# Patient Record
Sex: Female | Born: 1985
Health system: Southern US, Community
[De-identification: ages and names within clinical notes are randomized; demographics above are authoritative.]

## PROBLEM LIST (undated history)

## (undated) DIAGNOSIS — D649 Anemia, unspecified: Secondary | ICD-10-CM

---

## 2015-11-09 ENCOUNTER — Ambulatory Visit (HOSPITAL_COMMUNITY)
Admission: EM | Admit: 2015-11-09 | Discharge: 2015-11-09 | Disposition: A | Discharge status: Home / Self Care | Payer: BLUE CROSS/BLUE SHIELD | Attending: Emergency Medicine | Admitting: Emergency Medicine

## 2015-11-09 ENCOUNTER — Encounter (HOSPITAL_COMMUNITY): Payer: Self-pay | Admitting: Emergency Medicine

## 2015-11-09 ENCOUNTER — Ambulatory Visit (INDEPENDENT_AMBULATORY_CARE_PROVIDER_SITE_OTHER): Payer: BLUE CROSS/BLUE SHIELD

## 2015-11-09 DIAGNOSIS — R5383 Other fatigue: Secondary | ICD-10-CM

## 2015-11-09 DIAGNOSIS — J40 Bronchitis, not specified as acute or chronic: Secondary | ICD-10-CM

## 2015-11-09 HISTORY — DX: Anemia, unspecified: D64.9

## 2015-11-09 MED ORDER — ACETAMINOPHEN 325 MG PO TABS
ORAL_TABLET | ORAL | Status: AC
  Filled 2015-11-09: qty 2

## 2015-11-09 MED ORDER — BENZONATATE 100 MG PO CAPS: 100.0000 mg | ORAL_CAPSULE | Freq: Three times a day (TID) | ORAL | 0 refills | Status: DC | PRN

## 2015-11-09 MED ORDER — PREDNISONE 20 MG PO TABS: 40.0000 mg | ORAL_TABLET | Freq: Every day | ORAL | 0 refills | Status: AC

## 2015-11-09 MED ORDER — ALBUTEROL SULFATE HFA 108 (90 BASE) MCG/ACT IN AERS: 2.0000 | INHALATION_SPRAY | Freq: Four times a day (QID) | RESPIRATORY_TRACT | 0 refills | Status: DC | PRN

## 2015-11-09 MED ORDER — ACETAMINOPHEN 325 MG PO TABS
650.0000 mg | ORAL_TABLET | Freq: Once | ORAL | Status: AC
  Administered 2015-11-09: 650 mg via ORAL

## 2015-11-09 NOTE — Discharge Instructions (Signed)
Start Prednisone daily for 5 days. Use Albuterol inhaler 2 puffs every 6 hours as needed for wheezing and coughing. Take Tessalon cough pills every 8 hours as needed for cough. Increase fluid intake to help loosen up mucus. Follow-up with your primary care provider if symptoms do not improve within 3 to 4 days.

## 2015-11-09 NOTE — ED Provider Notes (Signed)
CSN: [id number]     Arrival date & time 11/09/15  1459 History   First MD Initiated Contact with Patient 11/09/15 1733     No chief complaint on file.  (Consider location/radiation/quality/duration/timing/severity/associated sxs/prior Treatment) 30 year old female presents with decreased energy, cough, fever up to 101/102, nasal congestion for the past 3 days. Getting worse today- having difficulty staying awake. Denies any GI symptoms. Has taken Tylenol and cough syrup with minimal relief. Does have history of anemia and has been on Fe pills in the past.    The history is provided by the patient.    Past Medical History:  Diagnosis Date  . Anemia    History reviewed. No pertinent surgical history. No family history on file. Social History  Substance Use Topics  . Smoking status: Never Smoker  . Smokeless tobacco: Never Used  . Alcohol use Yes   OB History    No data available     Review of Systems  Constitutional: Positive for chills, fatigue and fever.  HENT: Positive for congestion, rhinorrhea and sinus pressure. Negative for ear pain and sore throat.   Eyes: Negative for discharge.  Respiratory: Positive for cough and wheezing. Negative for chest tightness and shortness of breath.   Cardiovascular: Negative for chest pain.  Gastrointestinal: Negative for abdominal pain, diarrhea, nausea and vomiting.  Musculoskeletal: Negative for neck pain and neck stiffness.  Skin: Negative for rash.  Neurological: Positive for headaches. Negative for dizziness, syncope, weakness, light-headedness and numbness.  Hematological: Negative for adenopathy.    Allergies  Penicillins  Home Medications   Prior to Admission medications   Medication Sig Start Date End Date Taking? Authorizing Provider  albuterol (PROVENTIL HFA;VENTOLIN HFA) 108 (90 Base) MCG/ACT inhaler Inhale 2 puffs into the lungs every 6 (six) hours as needed for wheezing or shortness of breath. 11/09/15   Katy Apo, NP  benzonatate (TESSALON) 100 MG capsule Take 1 capsule (100 mg total) by mouth 3 (three) times daily as needed for cough. 11/09/15   Katy Apo, NP  predniSONE (DELTASONE) 20 MG tablet Take 2 tablets (40 mg total) by mouth daily. 11/09/15 11/14/15  Katy Apo, NP   Meds Ordered and Administered this Visit   Medications  acetaminophen (TYLENOL) tablet 650 mg (650 mg Oral Given 11/09/15 1645)    BP 123/60 (BP Location: Left Arm) Comment (BP Location): large cuff  Pulse 87   Temp 101.1 F (38.4 C) (Oral)   Resp 22   LMP 10/27/2015   SpO2 100%  No data found.   Physical Exam  Constitutional: She is oriented to person, place, and time. She appears well-developed and well-nourished. She appears ill. No distress.  HENT:  Head: Normocephalic and atraumatic.  Right Ear: Hearing, tympanic membrane, external ear and ear canal normal.  Left Ear: Hearing, tympanic membrane, external ear and ear canal normal.  Nose: Mucosal edema and rhinorrhea present. Right sinus exhibits no maxillary sinus tenderness and no frontal sinus tenderness. Left sinus exhibits no maxillary sinus tenderness and no frontal sinus tenderness.  Mouth/Throat: Uvula is midline, oropharynx is clear and moist and mucous membranes are normal.  Neck: Normal range of motion. Neck supple.  Cardiovascular: Normal rate, regular rhythm and normal heart sounds.   Pulmonary/Chest: Effort normal. No respiratory distress. She has decreased breath sounds (very coarse breath sounds) in the left upper field and the left lower field. She has wheezes in the right upper field and the left upper field. She has  rhonchi in the right upper field, the right lower field, the left upper field and the left lower field.  Lymphadenopathy:    She has no cervical adenopathy.  Neurological: She is alert and oriented to person, place, and time.  Skin: Skin is warm and dry. Capillary refill takes less than 2 seconds.  Psychiatric: She has a  normal mood and affect. Her behavior is normal. Judgment and thought content normal.    Urgent Care Course   Clinical Course    Procedures (including critical care time)  Labs Review Labs Reviewed - No data to display  Imaging Review Dg Chest 2 View  Result Date: 11/09/2015 CLINICAL DATA:  30 year old female with a history of weakness and fatigue EXAM: CHEST  2 VIEW COMPARISON:  None. FINDINGS: The heart size and mediastinal contours are within normal limits. Both lungs are clear. The visualized skeletal structures are unremarkable. IMPRESSION: No radiographic evidence of acute cardiopulmonary disease Signed, Dulcy Fanny. Earleen Newport, DO [facility] Radiology Electronically Signed   By: Corrie Mckusick D.O.   On: 11/09/2015 18:25     Visual Acuity Review  Right Eye Distance:   Left Eye Distance:   Bilateral Distance:    Right Eye Near:   Left Eye Near:    Bilateral Near:         MDM   1. Bronchitis   2. Fatigue, unspecified type    Reviewed chest x-ray with patient- no pneumonia. Recommend use Albuterol 2 puffs every 6 hours as needed for wheezing and cough. Recommend Benzonatate 1 tablet every 8 hours as needed for cough. Start Prednisone 40mg  daily for 5 days. May continue Tylenol as needed for fever. Recommend follow-up with a primary care provider in 3 to 4 days if not improving and within the next few weeks for routine labwork if fatigue does not improve.    Katy Apo, NP 11/10/15 [phone]

## 2015-11-09 NOTE — ED Triage Notes (Addendum)
Lack of energy that started 3 days ago.  Reports she normally takes iron and has not taken in 6 months.    Reports having mucus in throat and chest that started 2 days ago.  Patient reports chest tightness and cough.  Patient is repeatedly clearing throat and spitting up drainage from sinus.    Patient has said she has difficulty sleeping due to cough and congestion.  Patient says she is having trouble staying awake.   Recently moved to [place] from [place].

## 2016-11-13 ENCOUNTER — Encounter (HOSPITAL_COMMUNITY): Payer: Self-pay | Admitting: Family Medicine

## 2016-11-13 ENCOUNTER — Ambulatory Visit (HOSPITAL_COMMUNITY)
Admission: EM | Admit: 2016-11-13 | Discharge: 2016-11-13 | Disposition: A | Discharge status: Home / Self Care | Payer: 59 | Attending: Internal Medicine | Admitting: Internal Medicine

## 2016-11-13 DIAGNOSIS — Z113 Encounter for screening for infections with a predominantly sexual mode of transmission: Secondary | ICD-10-CM

## 2016-11-13 DIAGNOSIS — A6004 Herpesviral vulvovaginitis: Secondary | ICD-10-CM | POA: Insufficient documentation

## 2016-11-13 DIAGNOSIS — Z79899 Other long term (current) drug therapy: Secondary | ICD-10-CM | POA: Diagnosis not present

## 2016-11-13 DIAGNOSIS — N760 Acute vaginitis: Secondary | ICD-10-CM

## 2016-11-13 MED ORDER — VALACYCLOVIR HCL 1 G PO TABS: 1000.0000 mg | ORAL_TABLET | Freq: Every day | ORAL | 0 refills | Status: AC

## 2016-11-13 MED ORDER — FLUCONAZOLE 200 MG PO TABS: 200.0000 mg | ORAL_TABLET | Freq: Once | ORAL | 0 refills | Status: AC

## 2016-11-13 NOTE — ED Triage Notes (Signed)
Pt here for herpes outbreak.

## 2016-11-13 NOTE — Discharge Instructions (Signed)
Take diflucan tonight, may use OTC monistat externally as needed for itching. 5 days of valtrex for herpes. We will notify you of any positive findings from testing tonight, and if any changes to treatment need to be made. Please follow up with [facility]CP or [facility] if symptoms persist or do not improve

## 2016-11-13 NOTE — ED Provider Notes (Signed)
[place]    CSN: [id number] Arrival date & time: 11/13/16  1649     History   Chief Complaint Chief Complaint  Patient presents with  . Herpes Zoster    HPI Jamie Fitzgerald is a 31 y.o. female.   Jamie Fitzgerald presents with complaints of vulvovaginal rash which has been present for the past month and is uncomfortable. She has had herpes diagnosed in the past and this feels similar. Has been taking OTC Lysine which has not helped. She is sexually active with two partners, does not always use protection. LMP 10/28. She requests additional and complete STD screening tonight. States she has had itching and white discharge, itching is worse at night. Denies abdominal pain, back pain or fevers.    ROS per HPI.       Past Medical History:  Diagnosis Date  . Anemia     There are no active problems to display for this patient.   History reviewed. No pertinent surgical history.  OB History    No data available       Home Medications    Prior to Admission medications   Medication Sig Start Date End Date Taking? Authorizing Provider  albuterol (PROVENTIL HFA;VENTOLIN HFA) 108 (90 Base) MCG/ACT inhaler Inhale 2 puffs into the lungs every 6 (six) hours as needed for wheezing or shortness of breath. 11/09/15   Katy Apo, NP  benzonatate (TESSALON) 100 MG capsule Take 1 capsule (100 mg total) by mouth 3 (three) times daily as needed for cough. 11/09/15   Katy Apo, NP  valACYclovir (VALTREX) 1000 MG tablet Take 1 tablet (1,000 mg total) daily for 5 days by mouth. 11/13/16 11/18/16  Zigmund Gottron, NP    Family History History reviewed. No pertinent family history.  Social History Social History   Tobacco Use  . Smoking status: Never Smoker  . Smokeless tobacco: Never Used  Substance Use Topics  . Alcohol use: Yes  . Drug use: No     Allergies   Penicillins   Review of Systems Review of Systems   Physical Exam Triage Vital Signs ED  Triage Vitals  Enc Vitals Group     BP 11/13/16 1717 (!) 148/88     Pulse Rate 11/13/16 1717 60     Resp 11/13/16 1717 18     Temp 11/13/16 1717 98.5 F (36.9 C)     Temp src --      SpO2 11/13/16 1717 100 %     Weight --      Height --      Head Circumference --      Peak Flow --      Pain Score 11/13/16 1715 3     Pain Loc --      Pain Edu? --      Excl. in [place]? --    No data found.  Updated Vital Signs BP (!) 148/88   Pulse 60   Temp 98.5 F (36.9 C)   Resp 18   LMP 11/01/2016   SpO2 100%   Visual Acuity Right Eye Distance:   Left Eye Distance:   Bilateral Distance:    Right Eye Near:   Left Eye Near:    Bilateral Near:     Physical Exam  Constitutional: She is oriented to person, place, and time. She appears well-developed and well-nourished. No distress.  Cardiovascular: Normal rate, regular rhythm and normal heart sounds.  Pulmonary/Chest: Effort normal and breath sounds normal.  Abdominal:  Soft. There is no tenderness.  Genitourinary:    There is rash on the right labia. There is no lesion on the right labia. There is rash on the left labia. There is no lesion on the left labia.  Genitourinary Comments: Red smooth rash with defined edges noted; without open lesions, drainage or sores; without obvious discharge on visual exam  Neurological: She is alert and oriented to person, place, and time.  Skin: Skin is warm and dry.  Vitals reviewed.    UC Treatments / Results  Labs (all labs ordered are listed, but only abnormal results are displayed) Labs Reviewed  RPR  HIV ANTIBODY (ROUTINE TESTING)  URINE CYTOLOGY ANCILLARY ONLY    EKG  EKG Interpretation None       Radiology No results found.  Procedures Procedures (including critical care time)  Medications Ordered in UC Medications - No data to display   Initial Impression / Assessment and Plan / UC Course  I have reviewed the triage vital signs and the nursing notes.  Pertinent labs  & imaging results that were available during my care of the patient were reviewed by me and considered in my medical decision making (see chart for details).     Without obvious herpetic lesions, agreeable to treatment tonight however due to persistent rash and history of herpes. Rash more consistent with candidal vulvovaginitis. Diflucan provided tonight pending cytology results. Will notify of any positive findings and if any changes to treatment are needed. If symptoms worsen or do not improve in the next week to return to be seen or to follow up with PCP. Patient verbalized understanding and agreeable to plan.    Final Clinical Impressions(s) / UC Diagnoses   Final diagnoses:  Herpes simplex vulvovaginitis  Screen for STD (sexually transmitted disease)    ED Discharge Orders        Ordered    valACYclovir (VALTREX) 1000 MG tablet  Daily     11/13/16 1820       Controlled Substance Prescriptions [place] Controlled Substance Registry consulted? Not Applicable   Zigmund Gottron, NP 11/13/16 1844

## 2016-11-14 LAB — RPR: RPR Ser Ql: NONREACTIVE

## 2016-11-14 LAB — HIV ANTIBODY (ROUTINE TESTING W REFLEX): HIV Screen 4th Generation wRfx: NONREACTIVE

## 2016-11-16 LAB — URINE CYTOLOGY ANCILLARY ONLY
CHLAMYDIA, DNA PROBE: NEGATIVE
Neisseria Gonorrhea: NEGATIVE
Trichomonas: NEGATIVE

## 2016-11-18 LAB — URINE CYTOLOGY ANCILLARY ONLY
BACTERIAL VAGINITIS: POSITIVE — AB
CANDIDA VAGINITIS: POSITIVE — AB

## 2016-11-19 ENCOUNTER — Telehealth (HOSPITAL_COMMUNITY): Payer: Self-pay | Admitting: Emergency Medicine

## 2016-11-19 MED ORDER — METRONIDAZOLE 500 MG PO TABS: 500.0000 mg | ORAL_TABLET | Freq: Two times a day (BID) | ORAL | 0 refills | Status: DC

## 2016-11-19 NOTE — Telephone Encounter (Signed)
-----   Message from Sherlene Shams, MD sent at 11/18/2016  3:33 PM EST ----- Please let patient know that test for candida (yeast) was positive.  Rx fluconazole was given at the urgent care visit 11/9.   Test for gardnerella (bacterial vaginosis) was also positive.  This only needs to be treated if there are symptoms, such as persistent vaginal irritation/discharge.   If these symptoms are present, ok to send rx for metronidazole 500mg  bid x 7d #14 no refills or metronidazole vaginal gel 0.24% 1 applicatorful bid x 7d #09 no refills.   Recheck for further evaluation if symptoms are not improving.  LM

## 2016-11-19 NOTE — Addendum Note (Signed)
Addended by: Aquilla Solian E on: 11/19/2016 08:01 PM   Modules accepted: Orders

## 2016-11-19 NOTE — Telephone Encounter (Signed)
Called pt to notify of recent lab results.... Pt ID'd properly Reports feeling better and sx are subsiding Pt is asymptomatic but requests for flagyl to be called into [facility] [employer]) Adv pt if sx are not getting better to return or to f/u w/PCP Education on safe sex given Notified pt that lab results can be obtained through MyChart Pt verb understanding.

## 2017-01-01 ENCOUNTER — Encounter (HOSPITAL_COMMUNITY): Payer: Self-pay | Admitting: Family Medicine

## 2017-01-01 ENCOUNTER — Ambulatory Visit (HOSPITAL_COMMUNITY)
Admission: EM | Admit: 2017-01-01 | Discharge: 2017-01-01 | Disposition: A | Discharge status: Home / Self Care | Payer: 59 | Attending: Internal Medicine | Admitting: Internal Medicine

## 2017-01-01 DIAGNOSIS — N898 Other specified noninflammatory disorders of vagina: Secondary | ICD-10-CM | POA: Diagnosis not present

## 2017-01-01 DIAGNOSIS — Z880 Allergy status to penicillin: Secondary | ICD-10-CM | POA: Diagnosis not present

## 2017-01-01 DIAGNOSIS — Z79899 Other long term (current) drug therapy: Secondary | ICD-10-CM | POA: Diagnosis not present

## 2017-01-01 LAB — POCT PREGNANCY, URINE: Preg Test, Ur: NEGATIVE

## 2017-01-01 MED ORDER — FLUCONAZOLE 150 MG PO TABS: 150.0000 mg | ORAL_TABLET | Freq: Every day | ORAL | 0 refills | Status: DC

## 2017-01-01 MED ORDER — METRONIDAZOLE 0.75 % VA GEL: 1.0000 | Freq: Two times a day (BID) | VAGINAL | 0 refills | Status: AC

## 2017-01-01 NOTE — ED Provider Notes (Signed)
[place]    CSN: [id number] Arrival date & time: 01/01/17  1010     History   Chief Complaint Chief Complaint  Patient presents with  . Vaginitis    HPI Jamie Fitzgerald is a 31 y.o. female.   31 year old female comes in for a few day history of vaginal discharge, itchiness, irritation.  She states that she was treated for BV and yeast about a month ago, at that time, she split her BV treatment with her partner as she thought partner may have passed BV onto her.  She started  feeling similar irritations a few days ago after sexual intercourse, she then applied some scented lotion, which made symptoms worse and with vaginal irritation.  Denies fever, chills, night sweats.  Denies urinary symptoms such as discharge, frequency, dysuria, hematuria.  Denies abdominal pain, nausea, vomiting.  Sexually active with one partner, but no condom use, no other forms of birth control use.      Past Medical History:  Diagnosis Date  . Anemia     There are no active problems to display for this patient.   History reviewed. No pertinent surgical history.  OB History    No data available       Home Medications    Prior to Admission medications   Medication Sig Start Date End Date Taking? Authorizing Provider  albuterol (PROVENTIL HFA;VENTOLIN HFA) 108 (90 Base) MCG/ACT inhaler Inhale 2 puffs into the lungs every 6 (six) hours as needed for wheezing or shortness of breath. 11/09/15   Katy Apo, NP  fluconazole (DIFLUCAN) 150 MG tablet Take 1 tablet (150 mg total) by mouth daily. Take second dose 72 hours later if symptoms still persists. 01/01/17   Tasia Catchings, Elisa Sorlie V, PA-C  metroNIDAZOLE (METROGEL VAGINAL) 0.75 % vaginal gel Place 1 Applicatorful vaginally 2 (two) times daily for 5 days. 01/01/17 01/06/17  Ok Edwards, PA-C    Family History History reviewed. No pertinent family history.  Social History Social History   Tobacco Use  . Smoking status: Never Smoker  .  Smokeless tobacco: Never Used  Substance Use Topics  . Alcohol use: Yes  . Drug use: No     Allergies   Penicillins   Review of Systems Review of Systems  Reason unable to perform ROS: See HPI as above.     Physical Exam Triage Vital Signs ED Triage Vitals [01/01/17 1049]  Enc Vitals Group     BP 124/63     Pulse Rate 71     Resp 18     Temp 98.2 F (36.8 C)     Temp Source Oral     SpO2 100 %     Weight      Height      Head Circumference      Peak Flow      Pain Score      Pain Loc      Pain Edu?      Excl. in [place]?    No data found.  Updated Vital Signs BP 124/63 (BP Location: Left Arm)   Pulse 71   Temp 98.2 F (36.8 C) (Oral)   Resp 18   LMP 12/18/2016   SpO2 100%   Physical Exam  Constitutional: She is oriented to person, place, and time. She appears well-developed and well-nourished. No distress.  HENT:  Head: Normocephalic and atraumatic.  Eyes: Conjunctivae are normal. Pupils are equal, round, and reactive to light.  Cardiovascular: Normal rate, regular  rhythm and normal heart sounds. Exam reveals no gallop and no friction rub.  No murmur heard. Pulmonary/Chest: Effort normal and breath sounds normal. She has no wheezes. She has no rales.  Abdominal: Soft. Bowel sounds are normal. She exhibits no mass. There is no tenderness. There is no rebound, no guarding and no CVA tenderness.  Neurological: She is alert and oriented to person, place, and time.  Skin: Skin is warm and dry.  Psychiatric: She has a normal mood and affect. Her behavior is normal. Judgment normal.     UC Treatments / Results  Labs (all labs ordered are listed, but only abnormal results are displayed) Labs Reviewed  POCT PREGNANCY, URINE  URINE CYTOLOGY ANCILLARY ONLY    EKG  EKG Interpretation None       Radiology No results found.  Procedures Procedures (including critical care time)  Medications Ordered in UC Medications - No data to display   Initial  Impression / Assessment and Plan / UC Course  I have reviewed the triage vital signs and the nursing notes.  Pertinent labs & imaging results that were available during my care of the patient were reviewed by me and considered in my medical decision making (see chart for details).    Patient was treated empirically for yeast and BV. Start Diflucan and MetroGel as directed.  Refrain use of scented products, monitor for any new exposures. Cytology sent, patient will be contacted with any positive results that require additional treatment. Patient to refrain from sexual activity for the next 7 days. Return precautions given.    Final Clinical Impressions(s) / UC Diagnoses   Final diagnoses:  Vaginal discharge    ED Discharge Orders        Ordered    metroNIDAZOLE (METROGEL VAGINAL) 0.75 % vaginal gel  2 times daily     01/01/17 1145    fluconazole (DIFLUCAN) 150 MG tablet  Daily     01/01/17 1145         Ok Edwards, [place] 01/01/17 1149

## 2017-01-01 NOTE — ED Triage Notes (Signed)
Pt here for redness and irritation to vagina after using a cream on her vagina that was scented.

## 2017-01-01 NOTE — Discharge Instructions (Addendum)
You were treated empirically for yeast and bacterial vaginitis.  Start Diflucan and MetroGel as directed.  Cytology sent, you will be contacted with any positive results that requires further treatment. Refrain from sexual activity for the next 7 days. Monitor for any worsening of symptoms, fever, abdominal pain, nausea, vomiting, to follow up for reevaluation.

## 2017-01-04 LAB — URINE CYTOLOGY ANCILLARY ONLY
Chlamydia: NEGATIVE
NEISSERIA GONORRHEA: NEGATIVE
TRICH (WINDOWPATH): NEGATIVE

## 2017-01-07 LAB — URINE CYTOLOGY ANCILLARY ONLY: CANDIDA VAGINITIS: NEGATIVE

## 2017-01-08 ENCOUNTER — Telehealth (HOSPITAL_COMMUNITY): Payer: Self-pay | Admitting: *Deleted

## 2018-08-23 ENCOUNTER — Other Ambulatory Visit: Payer: Self-pay

## 2018-08-23 ENCOUNTER — Encounter (HOSPITAL_COMMUNITY): Payer: Self-pay | Admitting: Emergency Medicine

## 2018-08-23 ENCOUNTER — Emergency Department (HOSPITAL_COMMUNITY)
Admission: EM | Admit: 2018-08-23 | Discharge: 2018-08-24 | Disposition: A | Discharge status: Home / Self Care | Payer: Commercial Managed Care - PPO | Attending: Emergency Medicine | Admitting: Emergency Medicine

## 2018-08-23 DIAGNOSIS — D259 Leiomyoma of uterus, unspecified: Secondary | ICD-10-CM | POA: Insufficient documentation

## 2018-08-23 DIAGNOSIS — R102 Pelvic and perineal pain: Secondary | ICD-10-CM | POA: Insufficient documentation

## 2018-08-23 DIAGNOSIS — N83202 Unspecified ovarian cyst, left side: Secondary | ICD-10-CM

## 2018-08-23 DIAGNOSIS — N939 Abnormal uterine and vaginal bleeding, unspecified: Secondary | ICD-10-CM | POA: Diagnosis present

## 2018-08-23 LAB — CBC
HCT: 38.3 % (ref 36.0–46.0)
Hemoglobin: 11 g/dL — ABNORMAL LOW (ref 12.0–15.0)
MCH: 21 pg — ABNORMAL LOW (ref 26.0–34.0)
MCHC: 28.7 g/dL — ABNORMAL LOW (ref 30.0–36.0)
MCV: 73.2 fL — ABNORMAL LOW (ref 80.0–100.0)
Platelets: 353 10*3/uL (ref 150–400)
RBC: 5.23 MIL/uL — ABNORMAL HIGH (ref 3.87–5.11)
RDW: 15 % (ref 11.5–15.5)
WBC: 5.6 10*3/uL (ref 4.0–10.5)
nRBC: 0 % (ref 0.0–0.2)

## 2018-08-23 LAB — COMPREHENSIVE METABOLIC PANEL
ALT: 25 U/L (ref 0–44)
AST: 33 U/L (ref 15–41)
Albumin: 4.1 g/dL (ref 3.5–5.0)
Alkaline Phosphatase: 48 U/L (ref 38–126)
Anion gap: 12 (ref 5–15)
BUN: 5 mg/dL — ABNORMAL LOW (ref 6–20)
CO2: 21 mmol/L — ABNORMAL LOW (ref 22–32)
Calcium: 9.4 mg/dL (ref 8.9–10.3)
Chloride: 105 mmol/L (ref 98–111)
Creatinine, Ser: 0.73 mg/dL (ref 0.44–1.00)
GFR calc Af Amer: >60 mL/min (ref >60)
GFR calc non Af Amer: >60 mL/min (ref >60)
Glucose, Bld: 80 mg/dL (ref 70–99)
Potassium: 3.9 mmol/L (ref 3.5–5.1)
Sodium: 138 mmol/L (ref 135–145)
Total Bilirubin: 0.3 mg/dL (ref 0.3–1.2)
Total Protein: 7.9 g/dL (ref 6.5–8.1)

## 2018-08-23 LAB — I-STAT BETA HCG BLOOD, ED (MC, WL, AP ONLY): I-stat hCG, quantitative: <5 m[IU]/mL (ref <5)

## 2018-08-23 LAB — LIPASE, BLOOD: Lipase: 27 U/L (ref 11–51)

## 2018-08-23 MED ORDER — SODIUM CHLORIDE 0.9% FLUSH: 3.0000 mL | Freq: Once | INTRAVENOUS | Status: DC

## 2018-08-23 NOTE — ED Triage Notes (Signed)
Pt c/o abd pain 6/10 since last Thursday, pt states she started having vaginal bleed since last Wednesday with a big blood cloth on Thursday, pt states she never had a blood cloth like this she will like to be check for possible miscarriage or fibroids, per pt she took 2 pregnancy test and they were negative. No fever, chills, nausea or vomiting.

## 2018-08-24 ENCOUNTER — Emergency Department (HOSPITAL_COMMUNITY): Payer: Commercial Managed Care - PPO

## 2018-08-24 LAB — URINALYSIS, ROUTINE W REFLEX MICROSCOPIC
Bilirubin Urine: NEGATIVE
Glucose, UA: NEGATIVE mg/dL
Ketones, ur: 5 mg/dL — AB
Leukocytes,Ua: NEGATIVE
Nitrite: NEGATIVE
Protein, ur: 100 mg/dL — AB
RBC / HPF: >50 RBC/hpf — ABNORMAL HIGH (ref 0–5)
Specific Gravity, Urine: 1.023 (ref 1.005–1.030)
pH: 5 (ref 5.0–8.0)

## 2018-08-24 MED ORDER — IBUPROFEN 800 MG PO TABS: 800.0000 mg | ORAL_TABLET | Freq: Three times a day (TID) | ORAL | 0 refills | Status: DC

## 2018-08-24 NOTE — ED Notes (Signed)
Patient verbalizes understanding of discharge instructions. Opportunity for questioning and answers were provided. Armband removed by staff, pt discharged from ED ambulatory.   

## 2018-08-24 NOTE — ED Provider Notes (Signed)
[facility] EMERGENCY DEPARTMENT Provider Note   CSN: [id number] Arrival date & time: 08/23/18  4010     History   Chief Complaint Chief Complaint  Patient presents with  . Abdominal Pain    HPI Jamie Fitzgerald is a 33 y.o. female.     Patient presents to the emergency department with a chief complaint of vaginal bleeding.  She states that she passed a very large blood clot.  She was concerned that there might be something else going on.  She still has some cramping and some left adnexal pain.  She did not take anything for her symptoms.  She has tried an at home pregnancy test, which was negative x2.  She denies any fever, chills, or cough.  Denies any nausea, vomiting, or diarrhea.  Denies any dysuria.  Denies any unusual vaginal discharge, other than the bleeding.  The history is provided by the patient. No language interpreter was used.    Past Medical History:  Diagnosis Date  . Anemia     There are no active problems to display for this patient.   History reviewed. No pertinent surgical history.   OB History   No obstetric history on file.      Home Medications    Prior to Admission medications   Medication Sig Start Date End Date Taking? Authorizing Provider  albuterol (PROVENTIL HFA;VENTOLIN HFA) 108 (90 Base) MCG/ACT inhaler Inhale 2 puffs into the lungs every 6 (six) hours as needed for wheezing or shortness of breath. 11/09/15   Katy Apo, NP  fluconazole (DIFLUCAN) 150 MG tablet Take 1 tablet (150 mg total) by mouth daily. Take second dose 72 hours later if symptoms still persists. 01/01/17   Ok Edwards, PA-C    Family History No family history on file.  Social History Social History   Tobacco Use  . Smoking status: Never Smoker  . Smokeless tobacco: Never Used  Substance Use Topics  . Alcohol use: Yes  . Drug use: No     Allergies   Penicillins   Review of Systems Review of Systems  All other systems reviewed  and are negative.    Physical Exam Updated Vital Signs BP (!) 151/81 (BP Location: Right Arm)   Pulse 61   Temp 98.7 F (37.1 C) (Oral)   Resp 18   Ht 5\' 8"  (1.727 m)   Wt 95.3 kg   LMP 08/17/2018   SpO2 100%   BMI 31.93 kg/m   Physical Exam Vitals signs and nursing note reviewed.  Constitutional:      General: She is not in acute distress.    Appearance: She is well-developed.  HENT:     Head: Normocephalic and atraumatic.  Eyes:     Conjunctiva/sclera: Conjunctivae normal.  Neck:     Musculoskeletal: Neck supple.  Cardiovascular:     Rate and Rhythm: Normal rate and regular rhythm.     Heart sounds: No murmur.  Pulmonary:     Effort: Pulmonary effort is normal. No respiratory distress.     Breath sounds: Normal breath sounds.  Abdominal:     Palpations: Abdomen is soft.     Tenderness: There is no abdominal tenderness.  Musculoskeletal: Normal range of motion.  Skin:    General: Skin is warm and dry.  Neurological:     Mental Status: She is alert and oriented to person, place, and time.  Psychiatric:        Mood and Affect: Mood normal.  Behavior: Behavior normal.      ED Treatments / Results  Labs (all labs ordered are listed, but only abnormal results are displayed) Labs Reviewed  COMPREHENSIVE METABOLIC PANEL - Abnormal; Notable for the following components:      Result Value   CO2 21 (*)    BUN 5 (*)    All other components within normal limits  CBC - Abnormal; Notable for the following components:   RBC 5.23 (*)    Hemoglobin 11.0 (*)    MCV 73.2 (*)    MCH 21.0 (*)    MCHC 28.7 (*)    All other components within normal limits  LIPASE, BLOOD  URINALYSIS, ROUTINE W REFLEX MICROSCOPIC  I-STAT BETA HCG BLOOD, ED ([facility], [facility], [facility] ONLY)    EKG None  Radiology No results found.  Procedures Procedures (including critical care time)  Medications Ordered in ED Medications  sodium chloride flush (NS) 0.9 % injection 3 mL (has no  administration in time range)     Initial Impression / Assessment and Plan / ED Course  I have reviewed the triage vital signs and the nursing notes.  Pertinent labs & imaging results that were available during my care of the patient were reviewed by me and considered in my medical decision making (see chart for details).        Patient was vaginal bleeding and lower abdominal pain.  Vital signs are stable.  Patient is in no acute distress. She is not pregnant.  Hemoglobin is 11.0.  Ultrasound shows uterine fibroids and ovarian cyst.  Will refer to OB/GYN.  Will give anti-inflammatories. Final Clinical Impressions(s) / ED Diagnoses   Final diagnoses:  Uterine leiomyoma, unspecified location  Cyst of left ovary    ED Discharge Orders    None       Montine Circle, PA-C 08/24/18 0235    Ripley Fraise, MD 08/24/18 [phone]

## 2018-08-24 NOTE — ED Notes (Signed)
Sent urine culture with the specimen

## 2018-10-18 IMAGING — DX DG CHEST 2V
2 series · 2 of 2 positions shown · non-contrast
Comparison: None.

CLINICAL DATA: 30-year-old female with a history of weakness and
fatigue

EXAM:
CHEST  2 VIEW

[chest pa]
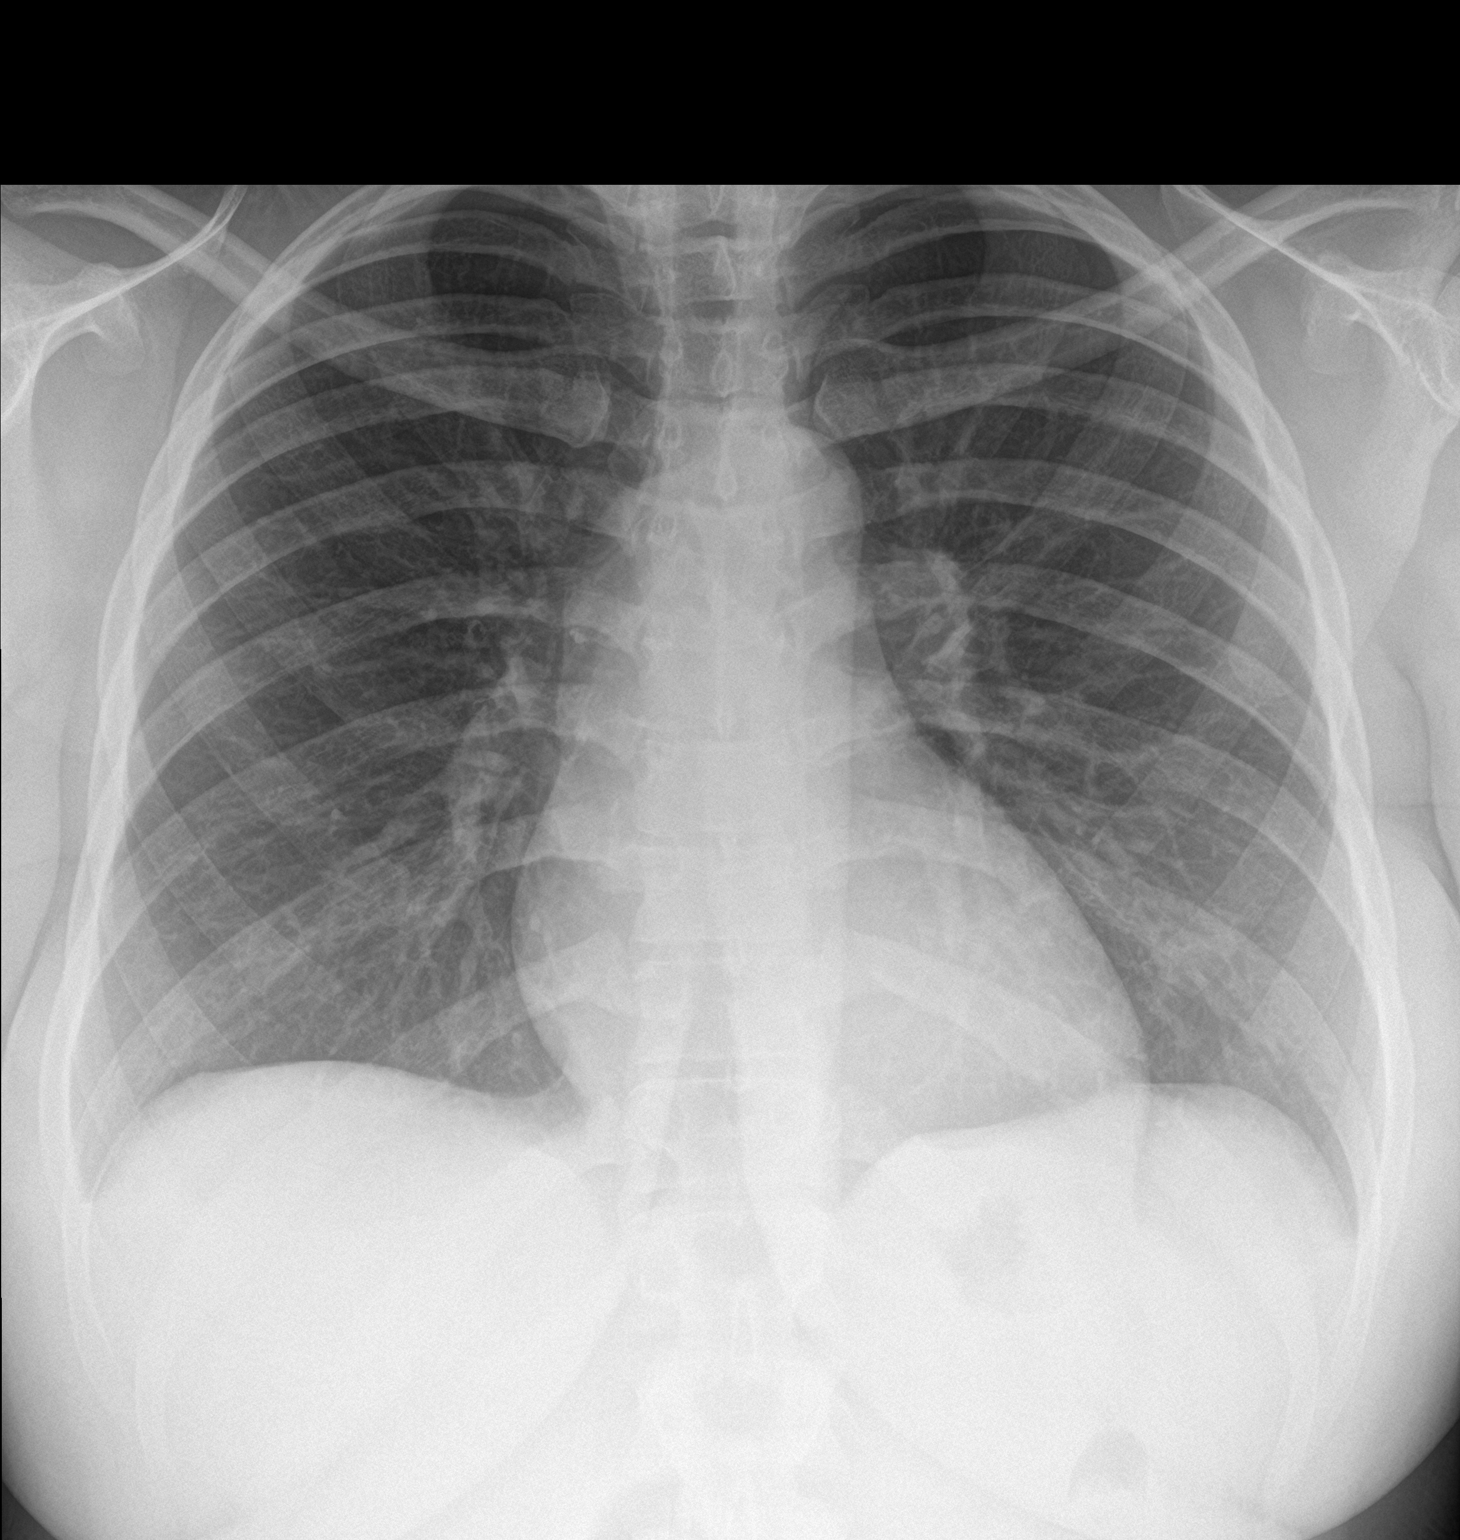

[chest lat]
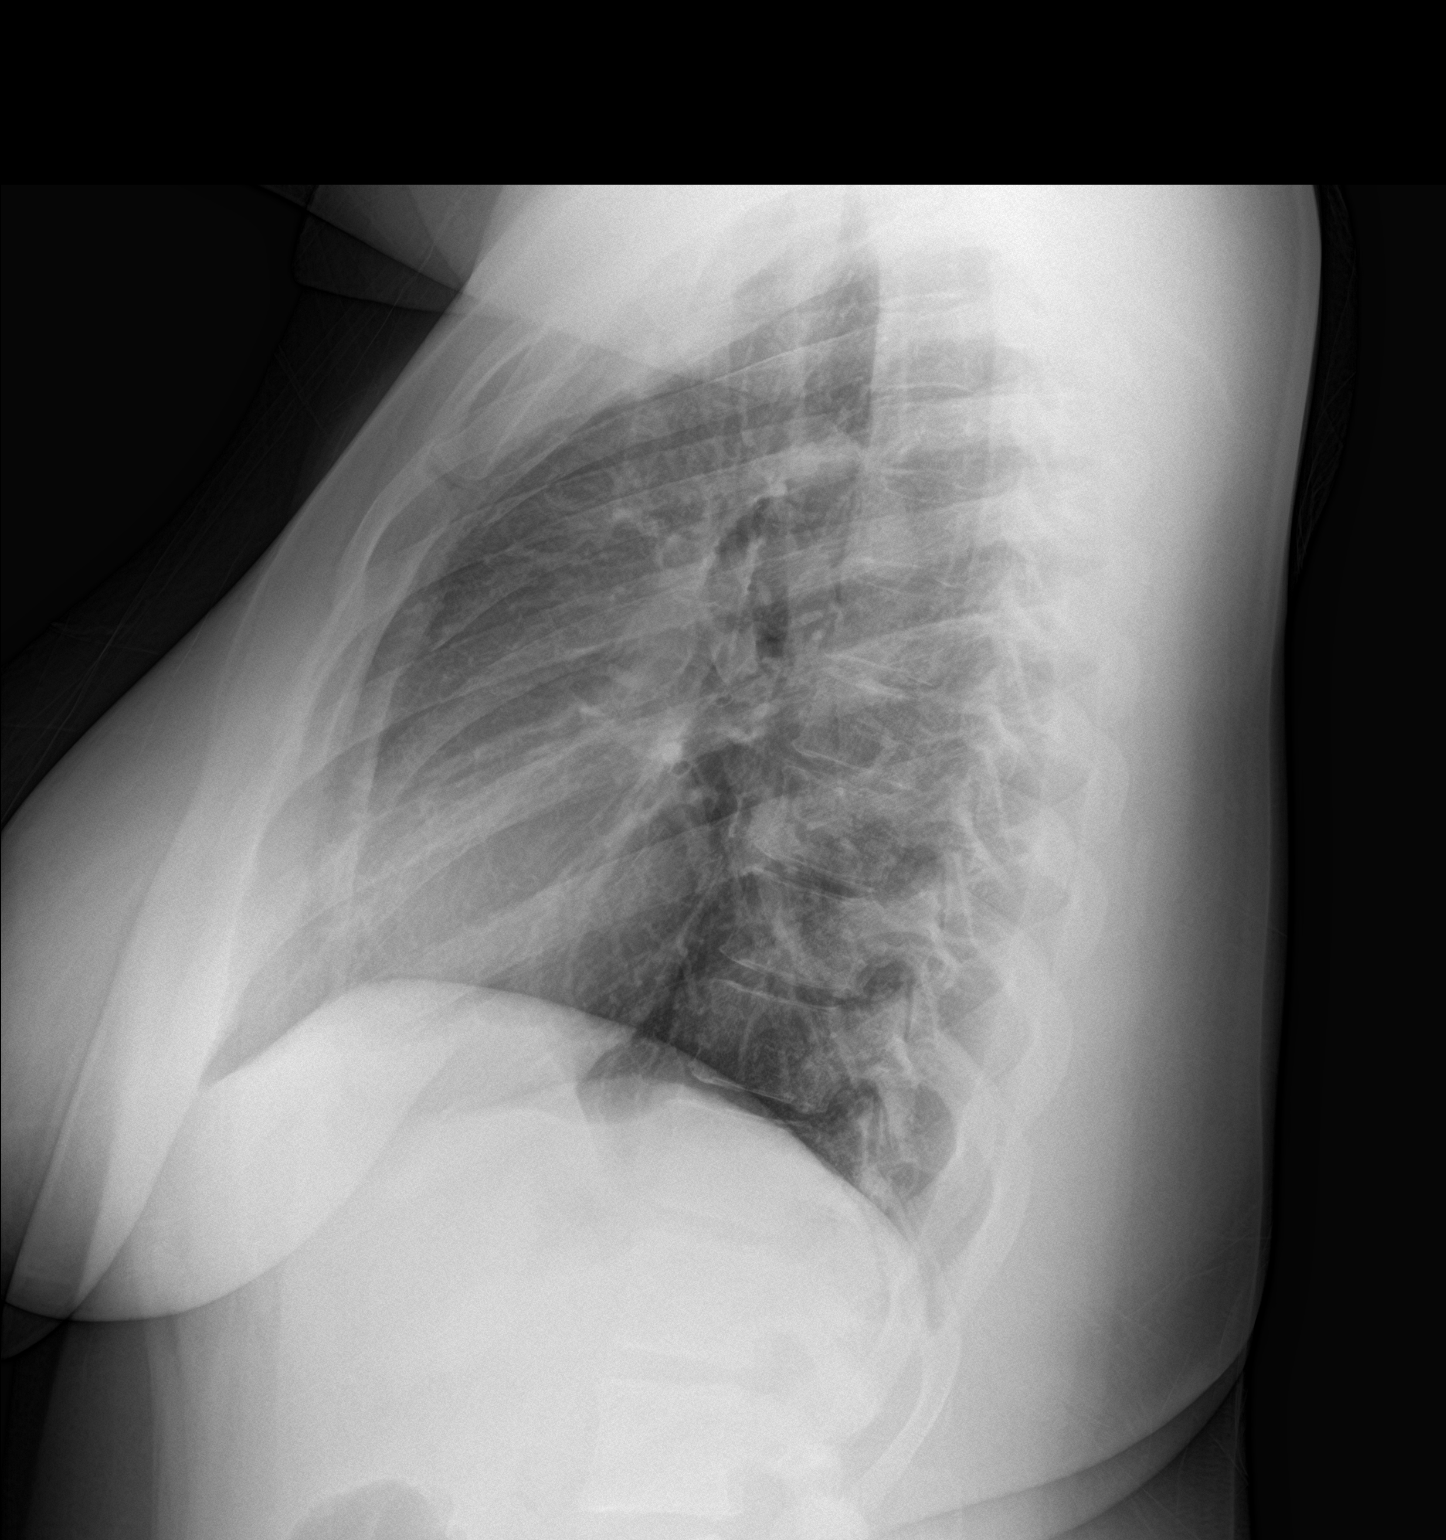

[2 of 2 positions shown; findings below may reference images not displayed]

FINDINGS: The heart size and mediastinal contours are within normal limits.
Both lungs are clear. The visualized skeletal structures are
unremarkable.
IMPRESSION: No radiographic evidence of acute cardiopulmonary disease

## 2018-11-07 ENCOUNTER — Other Ambulatory Visit: Payer: Self-pay

## 2018-11-08 ENCOUNTER — Encounter: Payer: Self-pay | Admitting: Family Medicine

## 2018-11-08 ENCOUNTER — Ambulatory Visit (INDEPENDENT_AMBULATORY_CARE_PROVIDER_SITE_OTHER): Payer: Commercial Managed Care - PPO | Admitting: Family Medicine

## 2018-11-08 VITALS — BP 128/80 | HR 102 | Ht 68.0 in | Wt 206.0 lb

## 2018-11-08 DIAGNOSIS — F322 Major depressive disorder, single episode, severe without psychotic features: Secondary | ICD-10-CM

## 2018-11-08 MED ORDER — CITALOPRAM HYDROBROMIDE 20 MG PO TABS: ORAL_TABLET | ORAL | 1 refills | Status: DC

## 2018-11-08 NOTE — Patient Instructions (Signed)
Living With Depression Everyone experiences occasional disappointment, sadness, and loss in their lives. When you are feeling down, blue, or sad for at least 2 weeks in a row, it may mean that you have depression. Depression can affect your thoughts and feelings, relationships, daily activities, and physical health. It is caused by changes in the way your brain functions. If you receive a diagnosis of depression, your health care provider will tell you which type of depression you have and what treatment options are available to you. If you are living with depression, there are ways to help you recover from it and also ways to prevent it from coming back. How to cope with lifestyle changes Coping with stress     Stress is your bodys reaction to life changes and events, both good and bad. Stressful situations may include:  Getting married.  The death of a spouse.  Losing a job.  Retiring.  Having a baby. Stress can last just a few hours or it can be ongoing. Stress can play a major role in depression, so it is important to learn both how to cope with stress and how to think about it differently. Talk with your health care provider or a counselor if you would like to learn more about stress reduction. He or she may suggest some stress reduction techniques, such as:  Music therapy. This can include creating music or listening to music. Choose music that you enjoy and that inspires you.  Mindfulness-based meditation. This kind of meditation can be done while sitting or walking. It involves being aware of your normal breaths, rather than trying to control your breathing.  Centering prayer. This is a kind of meditation that involves focusing on a spiritual word or phrase. Choose a word, phrase, or sacred image that is meaningful to you and that brings you peace.  Deep breathing. To do this, expand your stomach and inhale slowly through your nose. Hold your breath for 3-5 seconds, then exhale  slowly, allowing your stomach muscles to relax.  Muscle relaxation. This involves intentionally tensing muscles then relaxing them. Choose a stress reduction technique that fits your lifestyle and personality. Stress reduction techniques take time and practice to develop. Set aside 5-15 minutes a day to do them. Therapists can offer training in these techniques. The training may be covered by some insurance plans. Other things you can do to manage stress include:  Keeping a stress diary. This can help you learn what triggers your stress and ways to control your response.  Understanding what your limits are and saying no to requests or events that lead to a schedule that is too full.  Thinking about how you respond to certain situations. You may not be able to control everything, but you can control how you react.  Adding humor to your life by watching funny films or TV shows.  Making time for activities that help you relax and not feeling guilty about spending your time this way.  Medicines Your health care provider may suggest certain medicines if he or she feels that they will help improve your condition. Avoid using alcohol and other substances that may prevent your medicines from working properly (may interact). It is also important to:  Talk with your pharmacist or health care provider about all the medicines that you take, their possible side effects, and what medicines are safe to take together.  Make it your goal to take part in all treatment decisions (shared decision-making). This includes giving input on  the side effects of medicines. It is best if shared decision-making with your health care provider is part of your total treatment plan. If your health care provider prescribes a medicine, you may not notice the full benefits of it for 4-8 weeks. Most people who are treated for depression need to be on medicine for at least 6-12 months after they feel better. If you are taking  medicines as part of your treatment, do not stop taking medicines without first talking to your health care provider. You may need to have the medicine slowly decreased (tapered) over time to decrease the risk of harmful side effects. Relationships Your health care provider may suggest family therapy along with individual therapy and drug therapy. While there may not be family problems that are causing you to feel depressed, it is still important to make sure your family learns as much as they can about your mental health. Having your familys support can help make your treatment successful. How to recognize changes in your condition Everyone has a different response to treatment for depression. Recovery from major depression happens when you have not had signs of major depression for two months. This may mean that you will start to:  Have more interest in doing activities.  Feel less hopeless than you did 2 months ago.  Have more energy.  Overeat less often, or have better or improving appetite.  Have better concentration. Your health care provider will work with you to decide the next steps in your recovery. It is also important to recognize when your condition is getting worse. Watch for these signs:  Having fatigue or low energy.  Eating too much or too little.  Sleeping too much or too little.  Feeling restless, agitated, or hopeless.  Having trouble concentrating or making decisions.  Having unexplained physical complaints.  Feeling irritable, angry, or aggressive. Get help as soon as you or your family members notice these symptoms coming back. How to get support and help from others How to talk with friends and family members about your condition  Talking to friends and family members about your condition can provide you with one way to get support and guidance. Reach out to trusted friends or family members, explain your symptoms to them, and let them know that you are  working with a health care provider to treat your depression. Financial resources Not all insurance plans cover mental health care, so it is important to check with your insurance carrier. If paying for co-pays or counseling services is a problem, search for a local or county mental health care center. They may be able to offer public mental health care services at low or no cost when you are not able to see a private health care provider. If you are taking medicine for depression, you may be able to get the generic form, which may be less expensive. Some makers of prescription medicines also offer help to patients who cannot afford the medicines they need. Follow these instructions at home:   Get the right amount and quality of sleep.  Cut down on using caffeine, tobacco, alcohol, and other potentially harmful substances.  Try to exercise, such as walking or lifting small weights.  Take over-the-counter and prescription medicines only as told by your health care provider.  Eat a healthy diet that includes plenty of vegetables, fruits, whole grains, low-fat dairy products, and lean protein. Do not eat a lot of foods that are high in solid fats, added sugars, or salt.  Keep all follow-up visits as told by your health care provider. This is important. Contact a health care provider if:  You stop taking your antidepressant medicines, and you have any of these symptoms: ? Nausea. ? Headache. ? Feeling lightheaded. ? Chills and body aches. ? Not being able to sleep (insomnia).  You or your friends and family think your depression is getting worse. Get help right away if:  You have thoughts of hurting yourself or others. If you ever feel like you may hurt yourself or others, or have thoughts about taking your own life, get help right away. You can go to your nearest emergency department or call:  Your local emergency services (911 in the [place]  A suicide crisis helpline, such as the  [facility] at 506-636-1722. This is open 24-hours a day. Summary  If you are living with depression, there are ways to help you recover from it and also ways to prevent it from coming back.  Work with your health care team to create a management plan that includes counseling, stress management techniques, and healthy lifestyle habits. This information is not intended to replace advice given to you by your health care provider. Make sure you discuss any questions you have with your health care provider. Document Released: 11/25/2015 Document Revised: 04/15/2018 Document Reviewed: 11/25/2015 Elsevier Patient Education  2020 [employer].  Major Depressive Disorder, Adult Major depressive disorder (MDD) is a mental health condition. It may also be called clinical depression or unipolar depression. MDD usually causes feelings of sadness, hopelessness, or helplessness. MDD can also cause physical symptoms. It can interfere with work, school, relationships, and other everyday activities. MDD may be mild, moderate, or severe. It may occur once (single episode major depressive disorder) or it may occur multiple times (recurrent major depressive disorder). What are the causes? The exact cause of this condition is not known. MDD is most likely caused by a combination of things, which may include:  Genetic factors. These are traits that are passed along from parent to child.  Individual factors. Your personality, your behavior, and the way you handle your thoughts and feelings may contribute to MDD. This includes personality traits and behaviors learned from others.  Physical factors, such as: ? Differences in the part of your brain that controls emotion. This part of your brain may be different than it is in people who do not have MDD. ? Long-term (chronic) medical or psychiatric illnesses.  Social factors. Traumatic experiences or major life changes may play a role in the  development of MDD. What increases the risk? This condition is more likely to develop in women. The following factors may also make you more likely to develop MDD:  A family history of depression.  Troubled family relationships.  Abnormally low levels of certain brain chemicals.  Traumatic events in childhood, especially abuse or the loss of a parent.  Being under a lot of stress, or long-term stress, especially from upsetting life experiences or losses.  A history of: ? Chronic physical illness. ? Other mental health disorders. ? Substance abuse.  Poor living conditions.  Experiencing social exclusion or discrimination on a regular basis. What are the signs or symptoms? The main symptoms of MDD typically include:  Constant depressed or irritable mood.  Loss of interest in things and activities. MDD symptoms may also include:  Sleeping or eating too much or too little.  Unexplained weight change.  Fatigue or low energy.  Feelings of worthlessness or guilt.  Difficulty thinking clearly or making decisions.  Thoughts of suicide or of harming others.  Physical agitation or weakness.  Isolation. Severe cases of MDD may also occur with other symptoms, such as:  Delusions or hallucinations, in which you imagine things that are not real (psychotic depression).  Low-level depression that lasts at least a year (chronic depression or persistent depressive disorder).  Extreme sadness and hopelessness (melancholic depression).  Trouble speaking and moving (catatonic depression). How is this diagnosed? This condition may be diagnosed based on:  Your symptoms.  Your medical history, including your mental health history. This may involve tests to evaluate your mental health. You may be asked questions about your lifestyle, including any drug and alcohol use, and how long you have had symptoms of MDD.  A physical exam.  Blood tests to rule out other conditions. You  must have a depressed mood and at least four other MDD symptoms most of the day, nearly every day in the same 2-week timeframe before your health care provider can confirm a diagnosis of MDD. How is this treated? This condition is usually treated by mental health professionals, such as psychologists, psychiatrists, and clinical social workers. You may need more than one type of treatment. Treatment may include:  Psychotherapy. This is also called talk therapy or counseling. Types of psychotherapy include: ? Cognitive behavioral therapy (CBT). This type of therapy teaches you to recognize unhealthy feelings, thoughts, and behaviors, and replace them with positive thoughts and actions. ? Interpersonal therapy (IPT). This helps you to improve the way you relate to and communicate with others. ? Family therapy. This treatment includes members of your family.  Medicine to treat anxiety and depression, or to help you control certain emotions and behaviors.  Lifestyle changes, such as: ? Limiting alcohol and drug use. ? Exercising regularly. ? Getting plenty of sleep. ? Making healthy eating choices. ? Spending more time outdoors.  Treatments involving stimulation of the brain can be used in situations with extremely severe symptoms, or when medicine or other therapies do not work over time. These treatments include electroconvulsive therapy, transcranial magnetic stimulation, and vagal nerve stimulation. Follow these instructions at home: Activity  Return to your normal activities as told by your health care provider.  Exercise regularly and spend time outdoors as told by your health care provider. General instructions  Take over-the-counter and prescription medicines only as told by your health care provider.  Do not drink alcohol. If you drink alcohol, limit your alcohol intake to no more than 1 drink a day for nonpregnant women and 2 drinks a day for men. One drink equals 12 oz of beer, 5  oz of wine, or 1 oz of hard liquor. Alcohol can affect any antidepressant medicines you are taking. Talk to your health care provider about your alcohol use.  Eat a healthy diet and get plenty of sleep.  Find activities that you enjoy doing, and make time to do them.  Consider joining a support group. Your health care provider may be able to recommend a support group.  Keep all follow-up visits as told by your health care provider. This is important. Where to find more information [employer] on Mental Illness  www.nami.org [place] National Institute of Mental Health  https://carter.com/ National Suicide Prevention Lifeline  1-800-273-TALK 803-317-2708). This is free, 24-hour help. Contact a health care provider if:  Your symptoms get worse.  You develop new symptoms. Get help right away if:  You self-harm.  You have serious thoughts  about hurting yourself or others.  You see, hear, taste, smell, or feel things that are not present (hallucinate). This information is not intended to replace advice given to you by your health care provider. Make sure you discuss any questions you have with your health care provider. Document Released: 04/18/2012 Document Revised: 12/04/2016 Document Reviewed: 07/03/2015 Elsevier Patient Education  2020 [employer].
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
## 2018-11-14 ENCOUNTER — Ambulatory Visit: Payer: Self-pay | Admitting: Family Medicine

## 2018-11-14 MED ORDER — PAROXETINE HCL 10 MG PO TABS: ORAL_TABLET | ORAL | 0 refills | Status: DC

## 2018-11-14 NOTE — Addendum Note (Signed)
Addended by: Jon Billings on: 11/14/2018 02:30 PM   Modules accepted: Orders

## 2018-11-14 NOTE — Telephone Encounter (Signed)
Okay. I have switched her to Paxil. She is free to adjust the time of day that she takes this medicine that works best for her. I also believe that it would be best for her to see a psychiatrist.

## 2018-11-14 NOTE — Telephone Encounter (Signed)
Pt states took first dose of Celexa Saturday night. Reports Sunday, "Felt awful." Reports "Brain fog, shaking, crying a lot, twitching and increased anxiety." States "Stayed in bed all day."    States has not taken since and symptoms have resolved.  eclined appt; requesting alternate medication. States "I will not take that med again." Please advise.  CB# [phone]  Reason for Disposition . [1] Caller has NON-URGENT medication question about med that PCP prescribed AND [2] triager unable to answer question  Answer Assessment - Initial Assessment Questions 1.   NAME of MEDICATION: "What medicine are you calling about?"     Celexa 2.   QUESTION: "What is your question?"    Symptoms, alternate med 3.   PRESCRIBING HCP: "Who prescribed it?" Reason: if prescribed by specialist, call should be referred to that group.     *Dr Ethelene Hal 4. SYMPTOMS: "Do you have any symptoms?"    See summary, multiple 5. SEVERITY: If symptoms are present, ask "Are they mild, moderate or severe?"    severe  Protocols used: MEDICATION QUESTION CALL-A-AH

## 2018-11-14 NOTE — Telephone Encounter (Signed)
I called and spoke with pt. I made her aware that an Rx for Paxil was sent in. She will let us know if she has any problems with it.

## 2018-11-15 ENCOUNTER — Ambulatory Visit: Payer: Commercial Managed Care - PPO | Admitting: Psychology

## 2018-11-17 ENCOUNTER — Encounter: Payer: Self-pay | Admitting: Family Medicine

## 2018-11-21 ENCOUNTER — Ambulatory Visit: Payer: Commercial Managed Care - PPO | Admitting: Psychology

## 2018-11-29 ENCOUNTER — Encounter: Payer: Self-pay | Admitting: Family Medicine

## 2018-12-08 ENCOUNTER — Other Ambulatory Visit: Payer: Self-pay

## 2018-12-08 ENCOUNTER — Encounter: Payer: Self-pay | Admitting: Family Medicine

## 2018-12-08 ENCOUNTER — Ambulatory Visit (INDEPENDENT_AMBULATORY_CARE_PROVIDER_SITE_OTHER): Payer: Commercial Managed Care - PPO | Admitting: Family Medicine

## 2018-12-08 DIAGNOSIS — F322 Major depressive disorder, single episode, severe without psychotic features: Secondary | ICD-10-CM | POA: Diagnosis not present

## 2018-12-08 MED ORDER — PAROXETINE HCL 20 MG PO TABS: 20.0000 mg | ORAL_TABLET | Freq: Every day | ORAL | 0 refills | Status: AC
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
## 2018-12-09 ENCOUNTER — Telehealth: Payer: Self-pay | Admitting: Family Medicine

## 2018-12-09 NOTE — Telephone Encounter (Signed)
Dr. Ethelene Hal Please advise

## 2018-12-09 NOTE — Telephone Encounter (Signed)
Pt does not want to take paxil she would like to have PRN medication for depression and anxiety. [facility] [address]

## 2018-12-10 ENCOUNTER — Encounter (HOSPITAL_COMMUNITY): Payer: Self-pay | Admitting: Psychiatry

## 2018-12-10 ENCOUNTER — Ambulatory Visit (INDEPENDENT_AMBULATORY_CARE_PROVIDER_SITE_OTHER): Payer: Commercial Managed Care - PPO | Admitting: Psychiatry

## 2018-12-10 ENCOUNTER — Other Ambulatory Visit: Payer: Self-pay

## 2018-12-10 DIAGNOSIS — F4310 Post-traumatic stress disorder, unspecified: Secondary | ICD-10-CM | POA: Diagnosis not present

## 2018-12-10 DIAGNOSIS — F39 Unspecified mood [affective] disorder: Secondary | ICD-10-CM | POA: Diagnosis not present
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
## 2018-12-12 ENCOUNTER — Telehealth: Payer: Self-pay
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
## 2018-12-12 NOTE — Telephone Encounter (Signed)
Spoke with pt and informed her of Dr. Bebe Shaggy message. Pt understood and stated we should be expecting a paperwork for extended leave

## 2018-12-12 NOTE — Telephone Encounter (Signed)
There is no prn medicine for anxiety and depression. She needs to stay on Paxil.

## 2018-12-14 DIAGNOSIS — Z0279 Encounter for issue of other medical certificate: Secondary | ICD-10-CM

## 2018-12-15 NOTE — Telephone Encounter (Signed)
Form was completed and given to Lavella Lemons who will fax and inform pt

## 2018-12-24 ENCOUNTER — Other Ambulatory Visit: Payer: Self-pay | Admitting: Family Medicine

## 2018-12-24 DIAGNOSIS — F322 Major depressive disorder, single episode, severe without psychotic features: Secondary | ICD-10-CM

## 2021-08-02 IMAGING — US US PELVIS COMPLETE
1 series · 13 of 25 positions shown · non-contrast
Comparison: None.

CLINICAL DATA: Initial evaluation for acute left adnexal pain.

EXAM:
TRANSABDOMINAL AND TRANSVAGINAL ULTRASOUND OF PELVIS
DOPPLER ULTRASOUND OF OVARIES
TECHNIQUE: Both transabdominal and transvaginal ultrasound examinations of the
pelvis were performed. Transabdominal technique was performed for
global imaging of the pelvis including uterus, ovaries, adnexal
regions, and pelvic cul-de-sac.
It was necessary to proceed with endovaginal exam following the
transabdominal exam to visualize the uterus, endometrium, and
ovaries. Color and duplex Doppler ultrasound was utilized to
evaluate blood flow to the ovaries.

[Series 1: us pelvis complete · 13 of 56 slices shown]
[im 1/56]
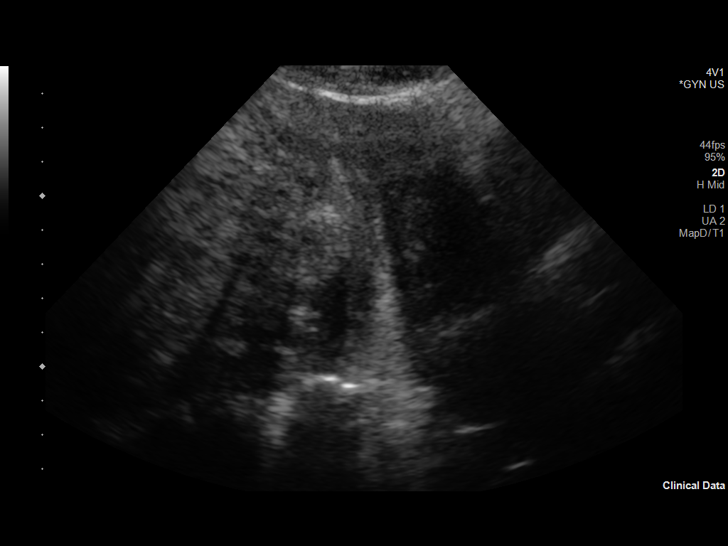
[im 5/56]
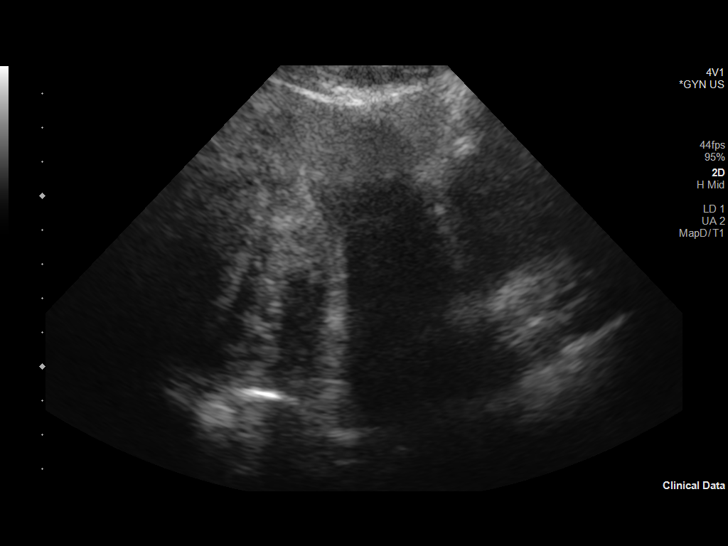
[im 10/56]
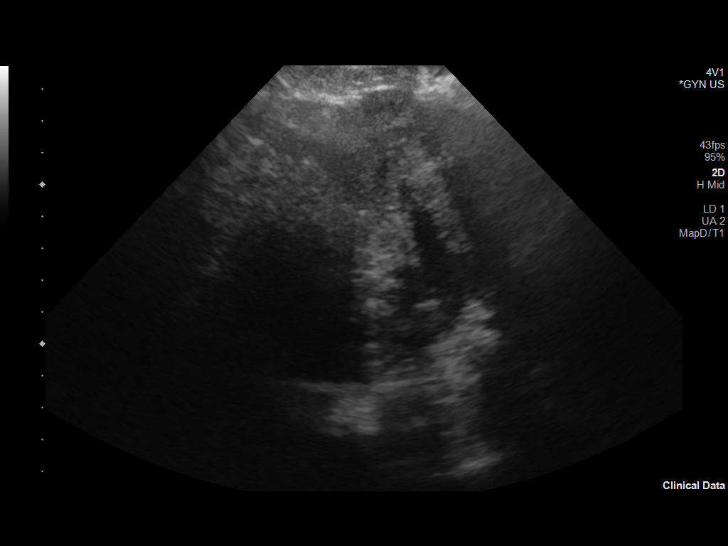
[im 14/56]
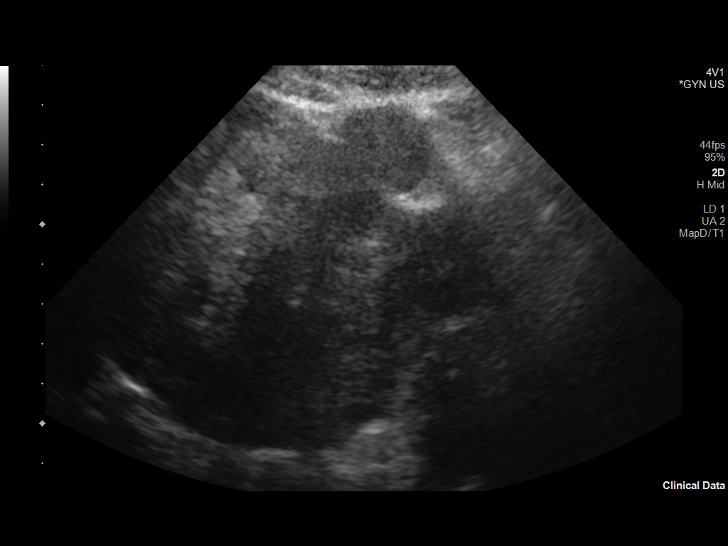
[im 19/56]
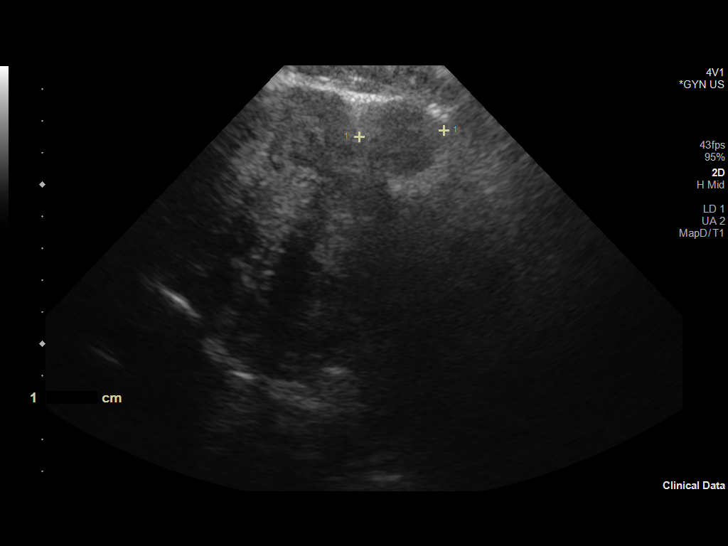
[im 23/56]
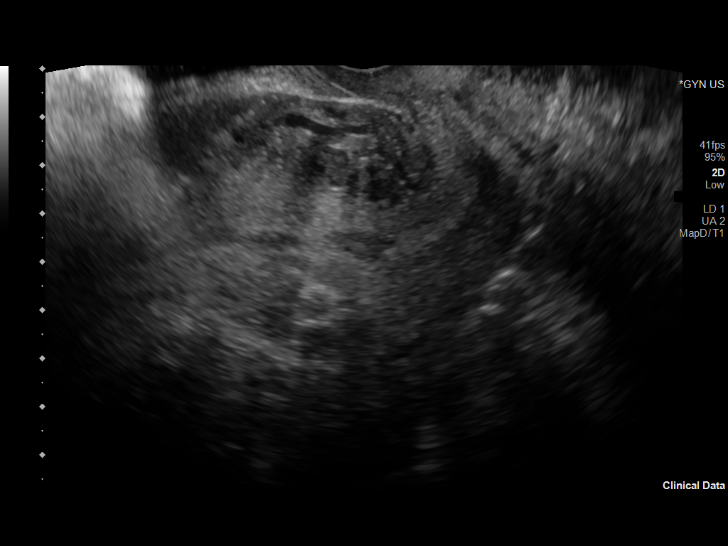
[im 28/56]
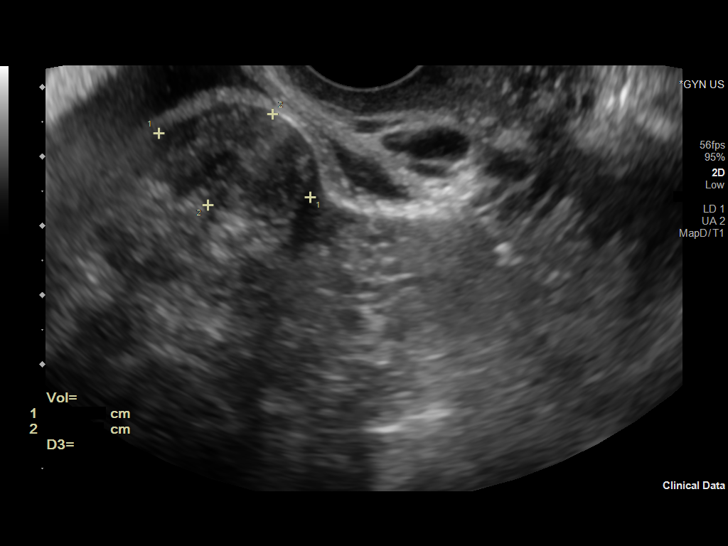
[im 33/56]
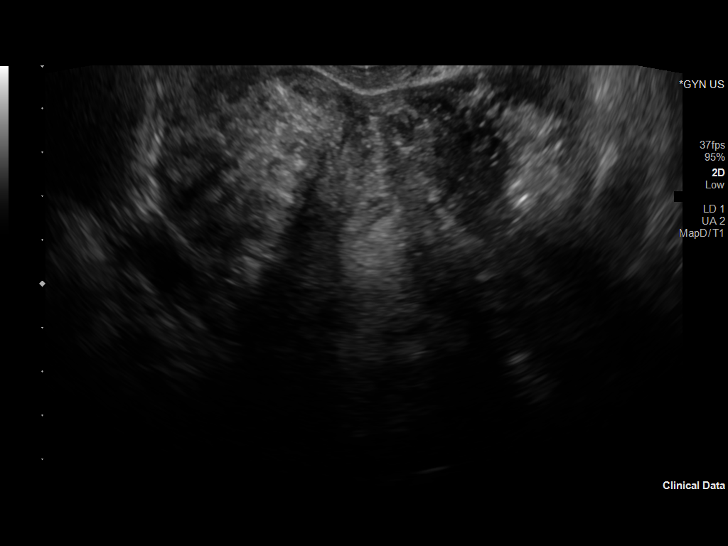
[im 37/56]
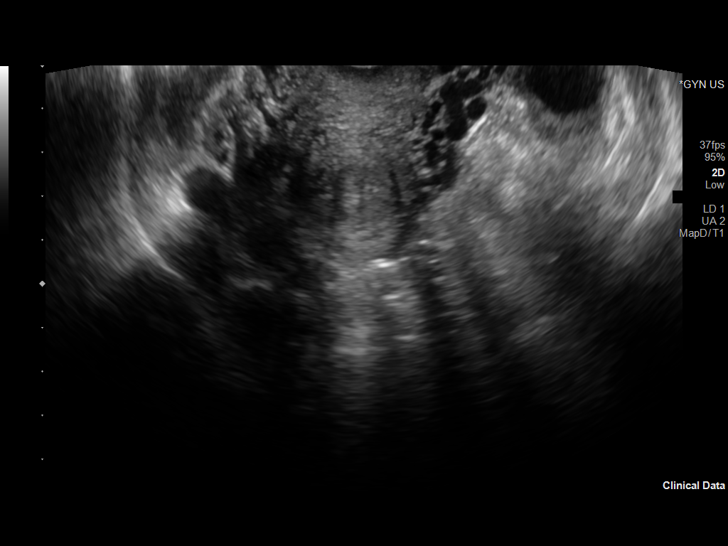
[im 42/56]
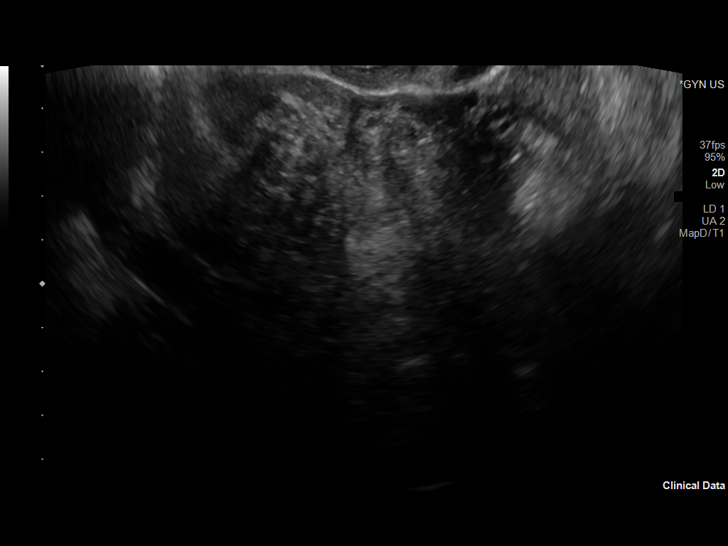
[im 46/56]
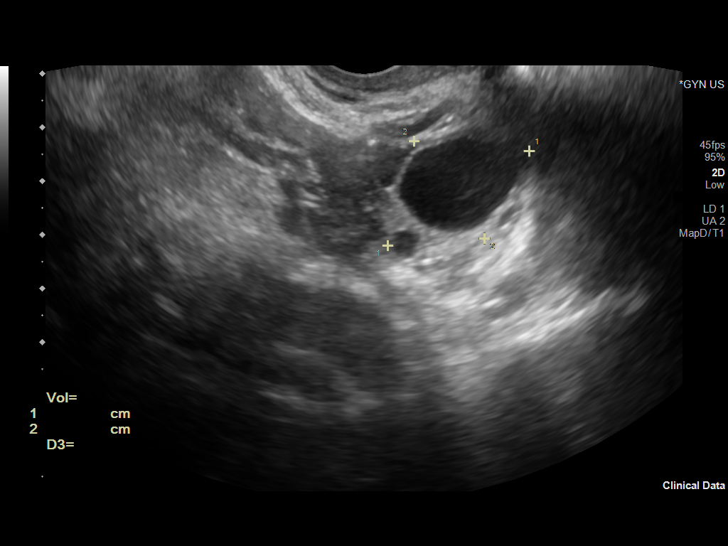
[im 51/56]
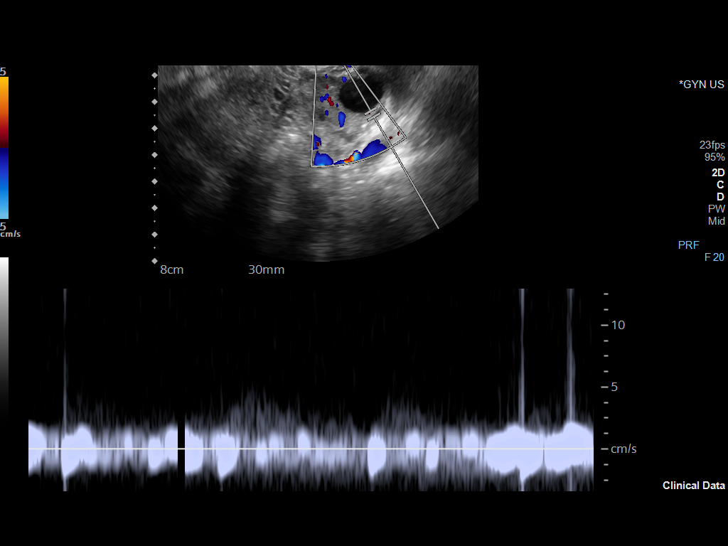
[im 56/56]
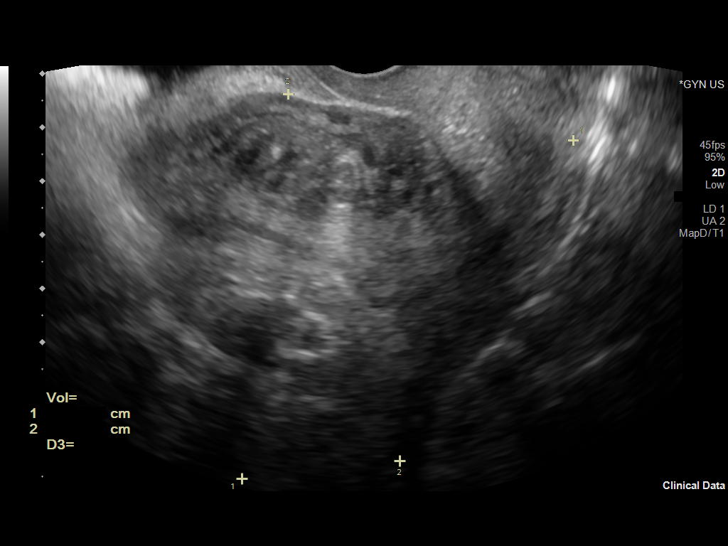

[13 of 25 positions shown; findings below may reference images not displayed]

FINDINGS: Uterus

Measurements: 8.5 x 5.8 x 5.7 cm = volume: 148.8 mL. 5.4 cm
exophytic fibroid seen extending from the right aspect of the
uterus. Additional 2.7 cm intramural fibroid present at the left
anterior uterine fundus.

Endometrium

Thickness: 2.6 mm.  No focal abnormality visualized.

Right ovary

Not visualized.  No adnexal mass.

Left ovary

Measurements: 3.2 x 2.2 x 2.4 cm = volume: 8.5 mL. 2.2 cm simple
cyst, most consistent with a normal physiologic follicular
cyst/dominant follicle.

Pulsed Doppler evaluation of the left ovary demonstrates normal
low-resistance arterial and venous waveforms.

Other findings

No abnormal free fluid.
IMPRESSION: 1. 2.2 cm simple left ovarian cyst, most consistent with a normal
physiologic follicular cyst/dominant follicle. No evidence for left
ovarian torsion.
2. Nonvisualization of the right ovary.  No right adnexal mass.
3. Enlarged fibroid uterus as above.
